# Patient Record
Sex: Male | Born: 1990 | Race: White | Hispanic: No | Marital: Single | Smoking: Never smoker
Health system: Southern US, Community
[De-identification: ages and names within clinical notes are randomized; demographics above are authoritative.]

---
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
## 2011-01-30 ENCOUNTER — Emergency Department: Payer: Self-pay | Admitting: Emergency Medicine

## 2011-08-11 ENCOUNTER — Emergency Department: Payer: Self-pay | Admitting: Emergency Medicine

## 2012-02-15 IMAGING — CT CT CERVICAL SPINE WITHOUT CONTRAST
1 series · 12 of 14 positions shown, 15 images · non-contrast
Comparison: none

REASON FOR EXAM: syncope with occiptal trauma
COMMENTS:

PROCEDURE:     CT  - CT CERVICAL SPINE WO  - January 30, 2011 [DATE]
RESULT:     CT cervical spine
TECHNIQUE: Helical 2 mm sections were obtained. Reconstructions were
performed utilizing a bone algorithm in coronal, sagittal, and axial planes.

[Series 5: axial · axial · 0.24mm/px · z∈[+118,+266]mm · 12 of 89 slices shown, 15 images]
[im 7/89  soft-tissue]
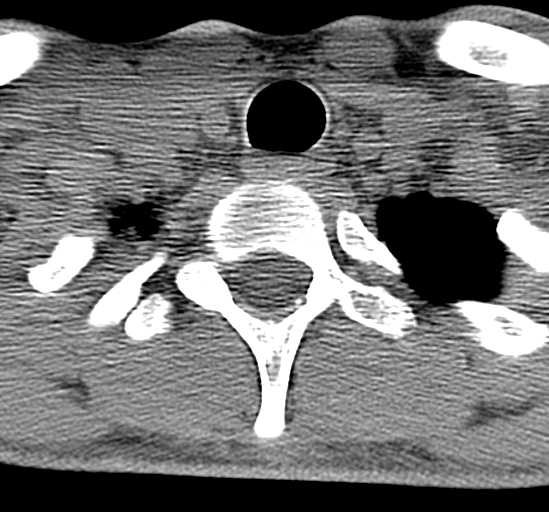
[im 7/89  bone]
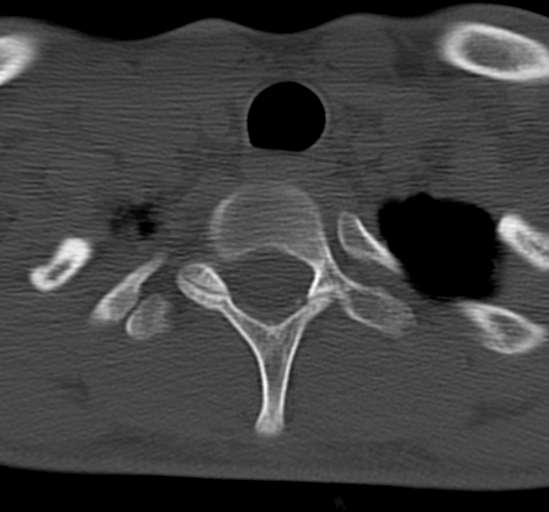
[im 14/89  bone]
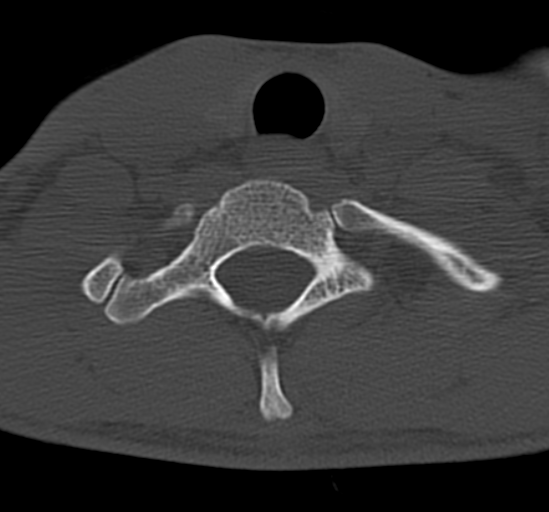
[im 21/89  bone]
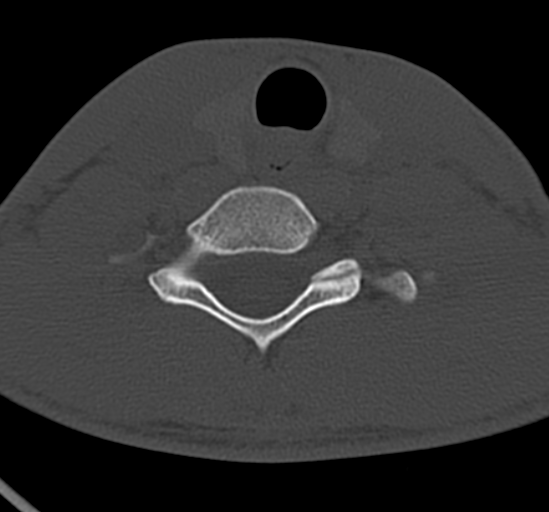
[im 28/89  bone]
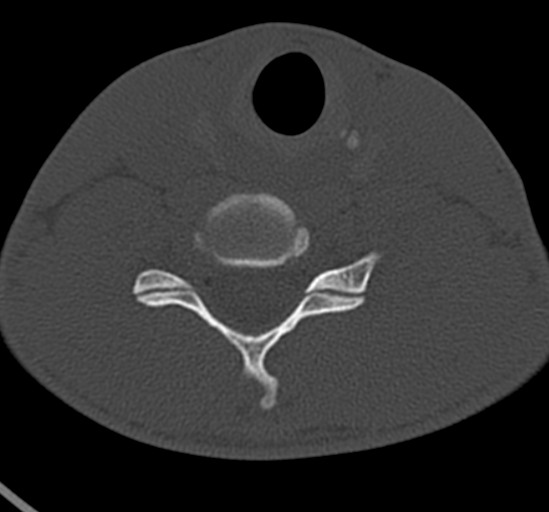
[im 34/89  soft-tissue]
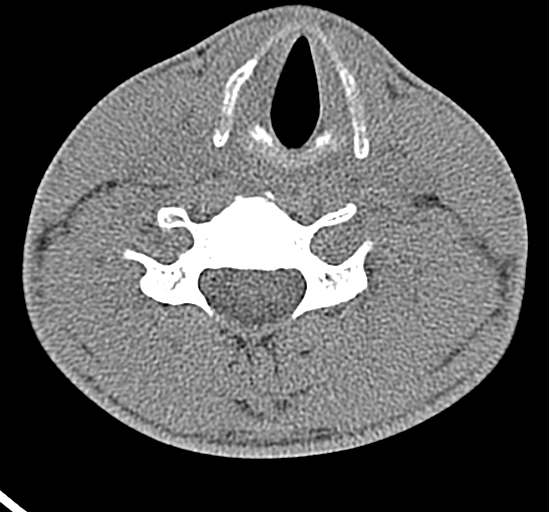
[im 34/89  bone]
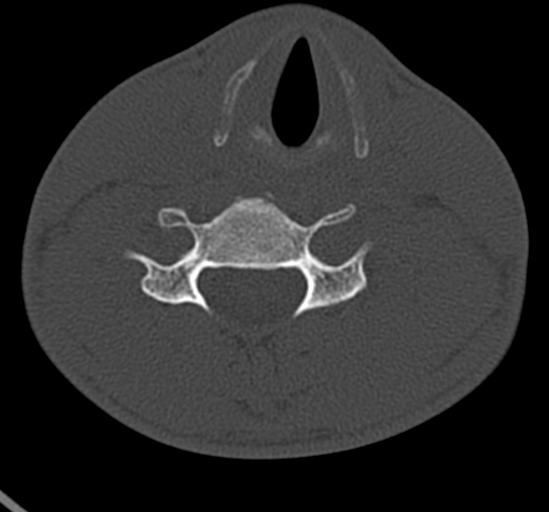
[im 41/89  bone]
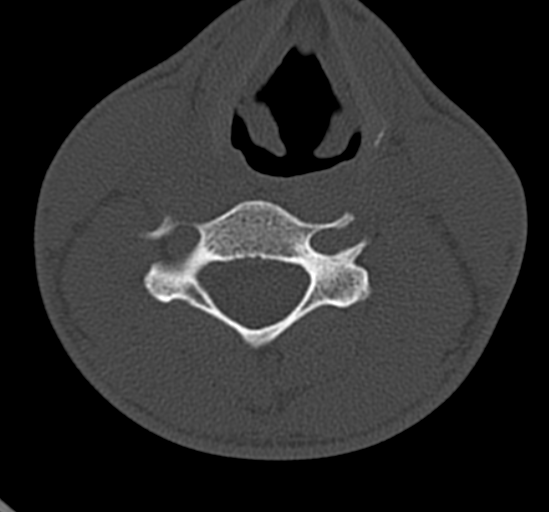
[im 48/89  bone]
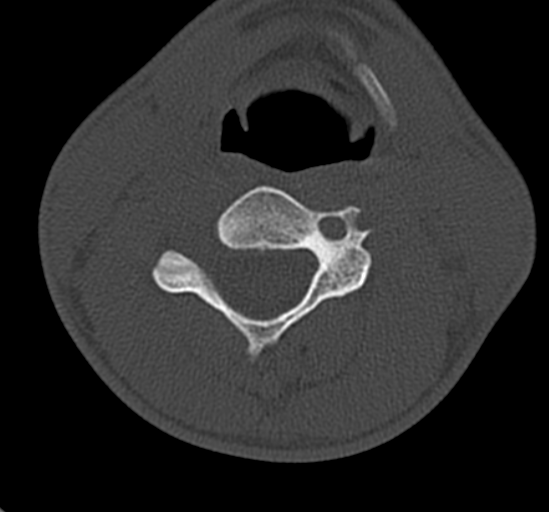
[im 55/89  bone]
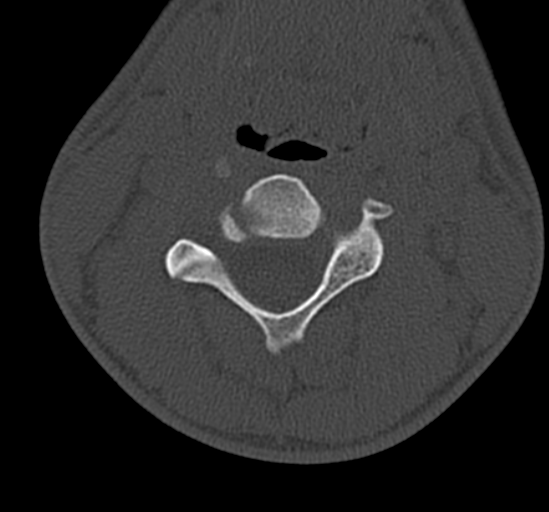
[im 61/89  soft-tissue]
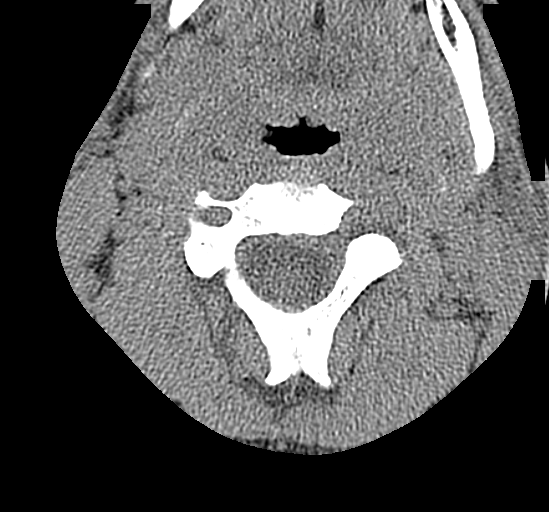
[im 61/89  bone]
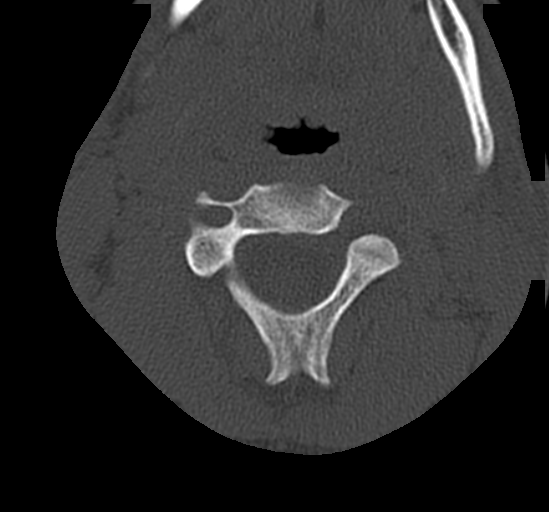
[im 68/89  bone]
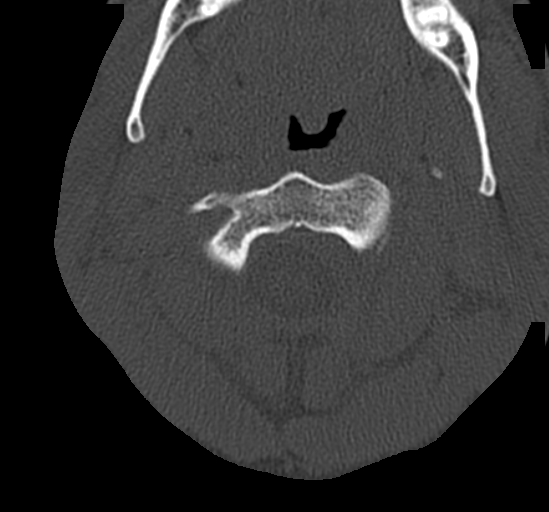
[im 75/89  bone]
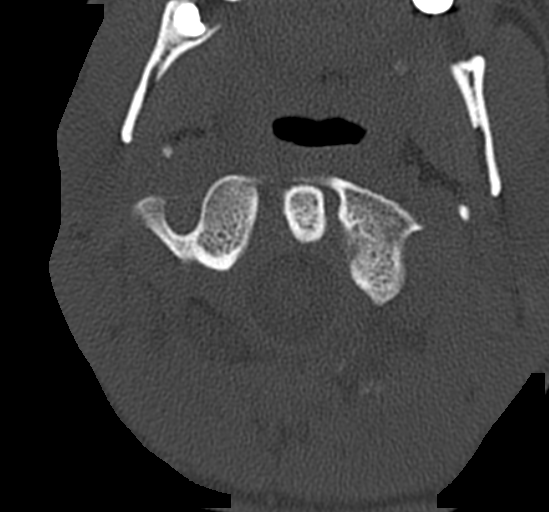
[im 82/89  bone]
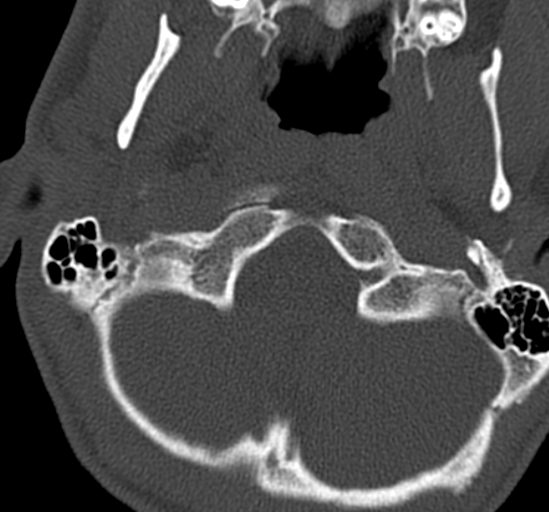

[12 of 14 positions shown; findings below may reference images not displayed]

FINDINGS: There is no evidence of acute fracture or dislocation. There is
dextroscoliosis of the cervical spine likely positional in nature. There is
mild reversible cervical lordosis likely due to positioning, muscle spasm
and/or collar placement. If there is concern of ligamentous instability
further evaluation with flexion and extension views recommended. There is no
evidence of prevertebral soft tissue swelling nor evidence of canal stenosis.
IMPRESSION: No CT evidence of acute osseous abnormalities.
2. Mild areas of malalignment as described above.

## 2012-02-15 IMAGING — CT CT HEAD WITHOUT CONTRAST
2 series · 16 of 30 positions shown, 20 images · non-contrast
Comparison: none

REASON FOR EXAM: syncope, occipital laceration
COMMENTS:

PROCEDURE:     CT  - CT HEAD WITHOUT CONTRAST  - January 30, 2011 [DATE]
RESULT:     Technique: Helical 5mm sections were obtained from the skull
base to the vertex without administration of intravenous contrast.

[Series 2: without · axial · non-contrast · 0.43mm/px · z∈[+232,+362]mm · 13 of 32 slices shown, 17 images]
[im 3/32  brain]
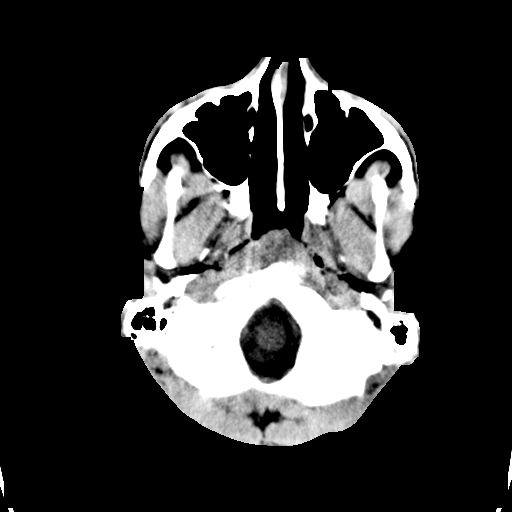
[im 3/32  bone]
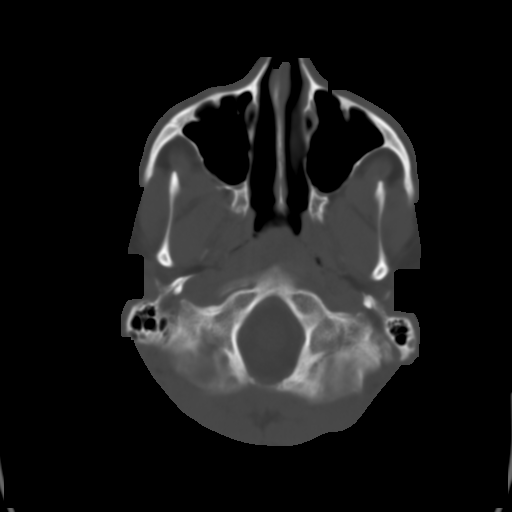
[im 5/32  brain]
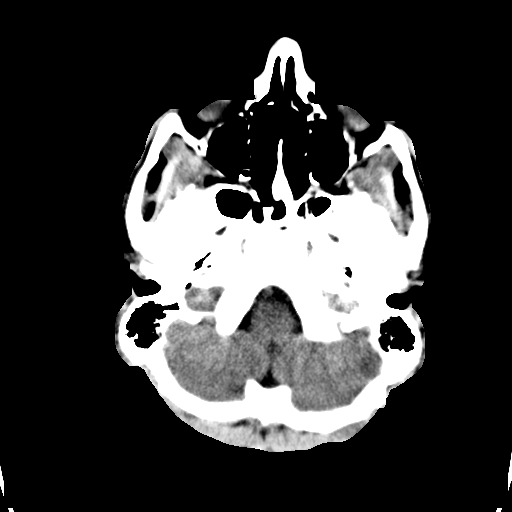
[im 7/32  brain]
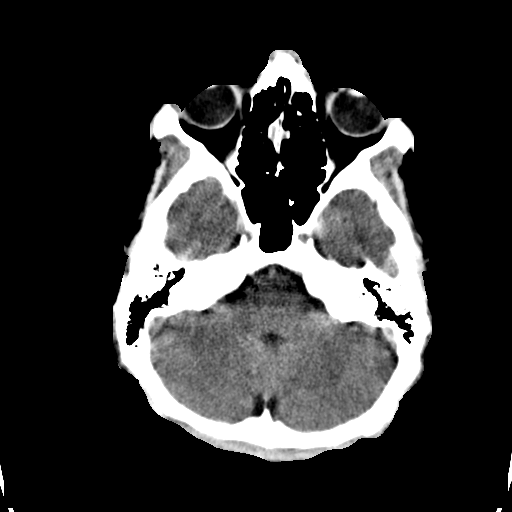
[im 9/32  brain]
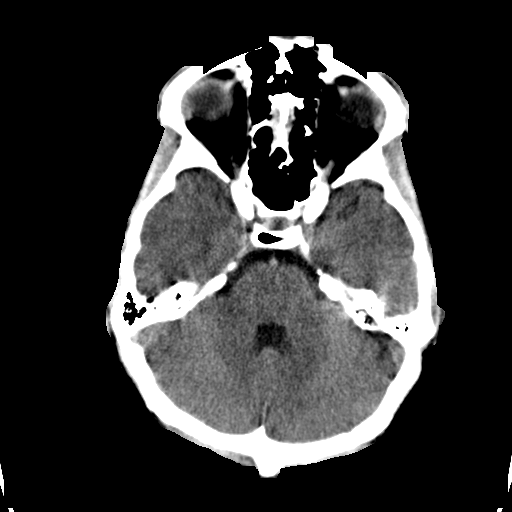
[im 12/32  brain]
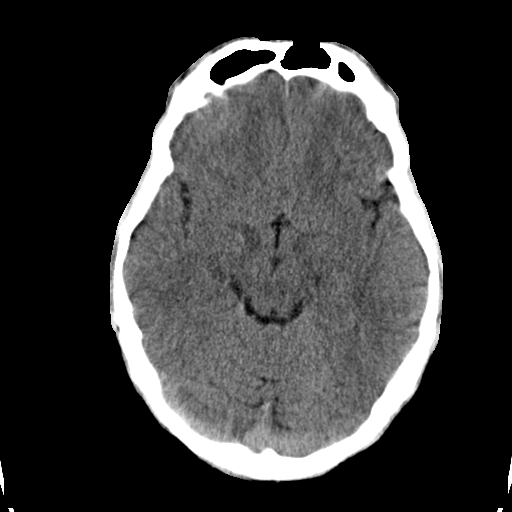
[im 12/32  bone]
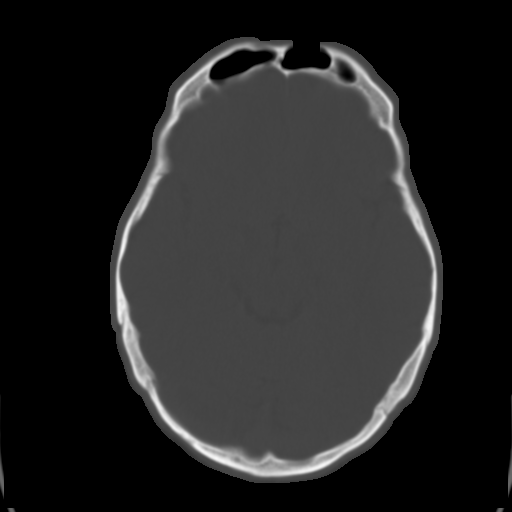
[im 14/32  brain]
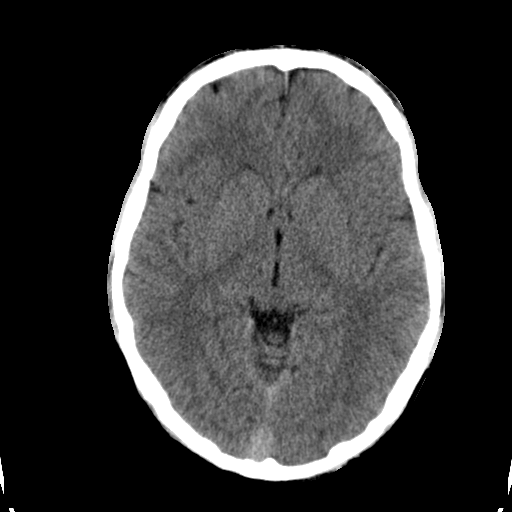
[im 16/32  brain]
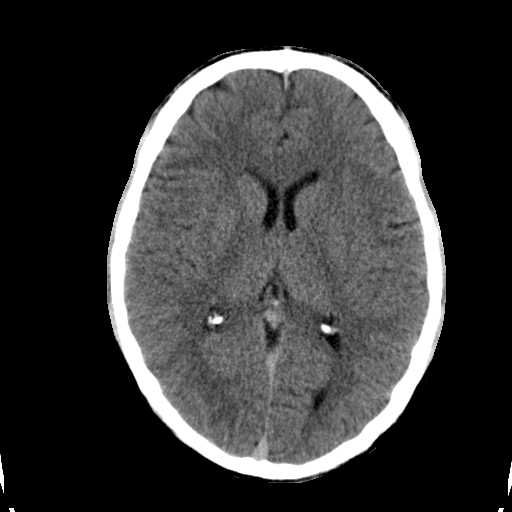
[im 18/32  brain]
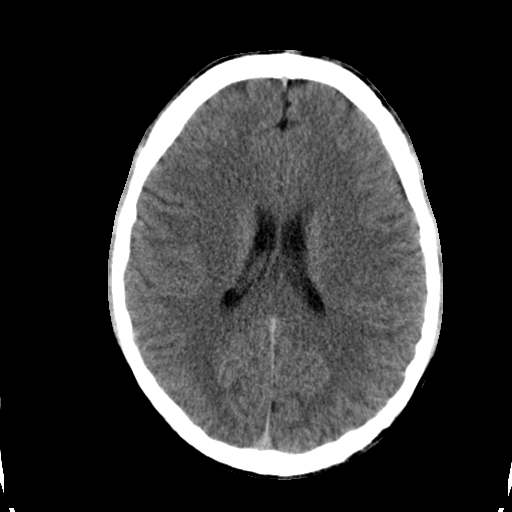
[im 20/32  brain]
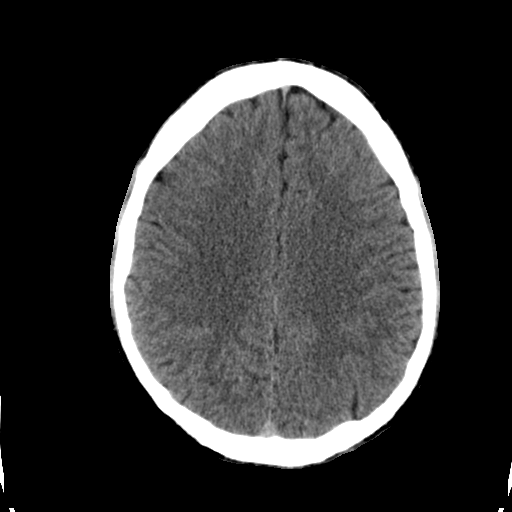
[im 20/32  bone]
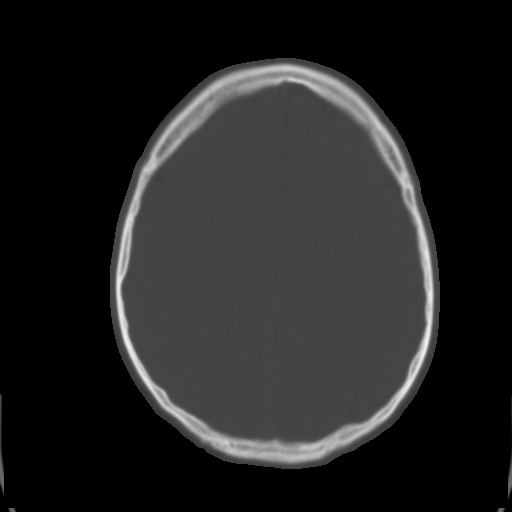
[im 23/32  brain]
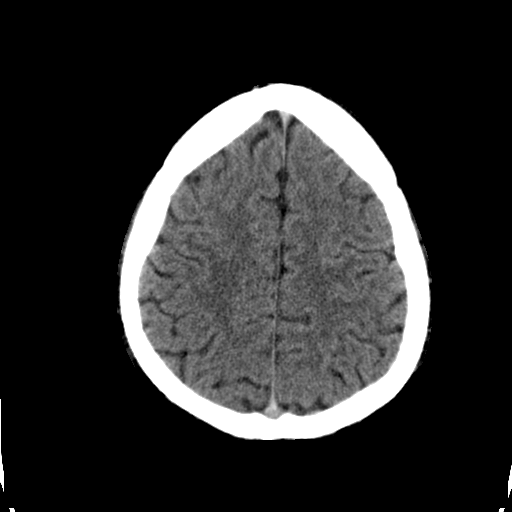
[im 25/32  brain]
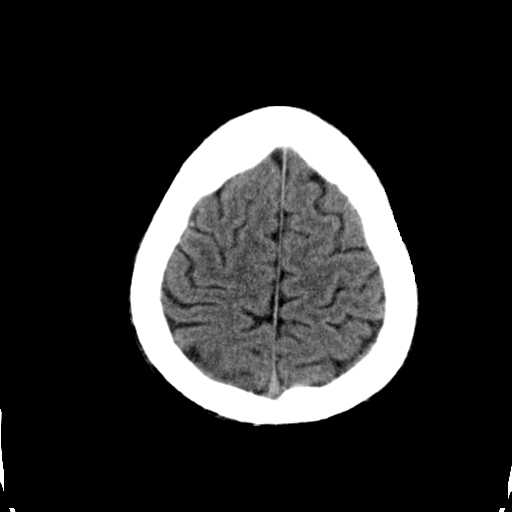
[im 27/32  brain]
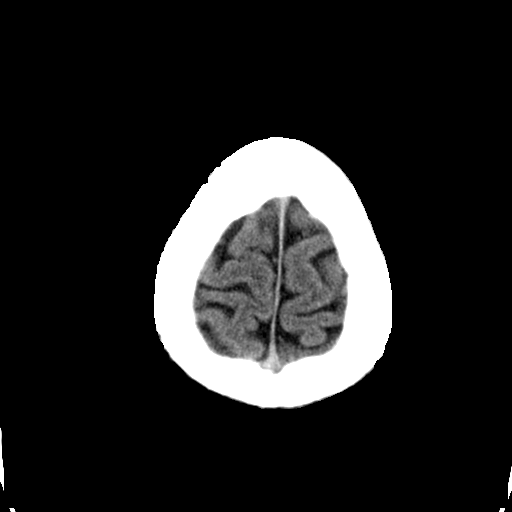
[im 29/32  brain]
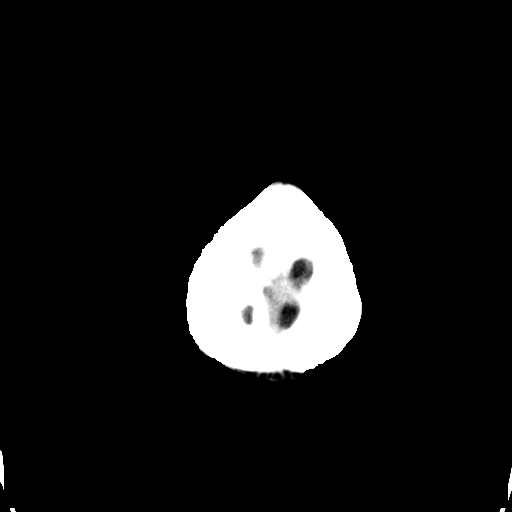
[im 29/32  bone]
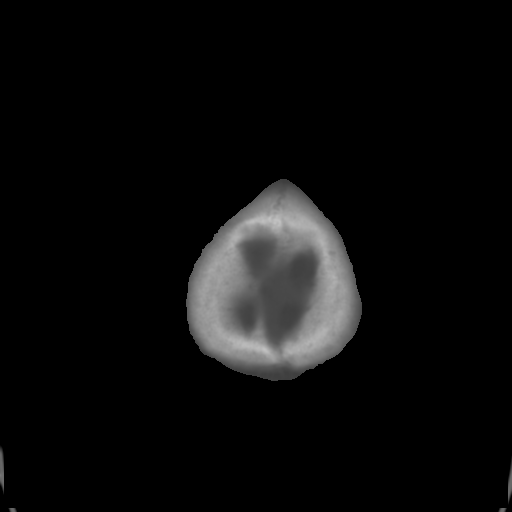

[Series 3: bone · axial · 0.43mm/px · z∈[+232,+278]mm · 3 of 32 slices shown]
[im 3/32  bone]
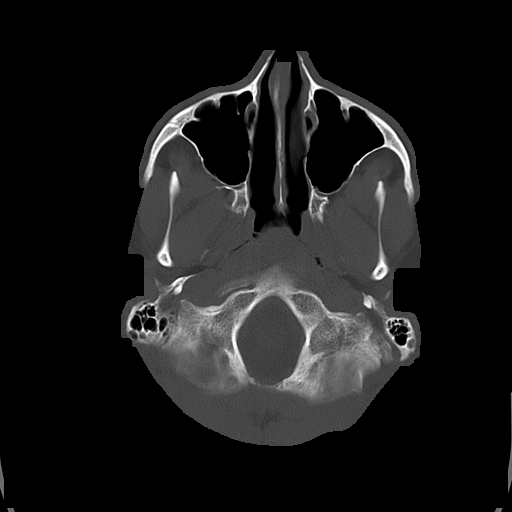
[im 7/32  bone]
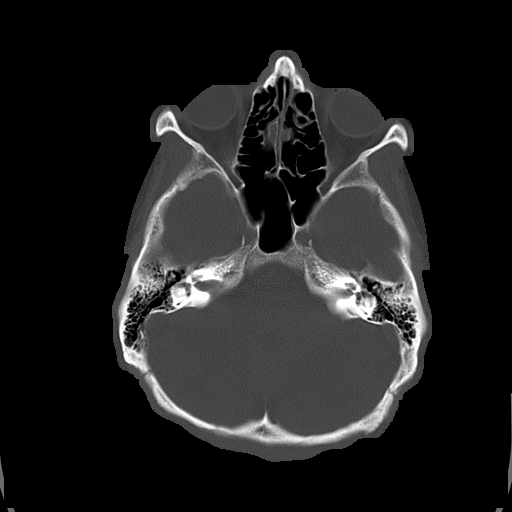
[im 12/32  bone]
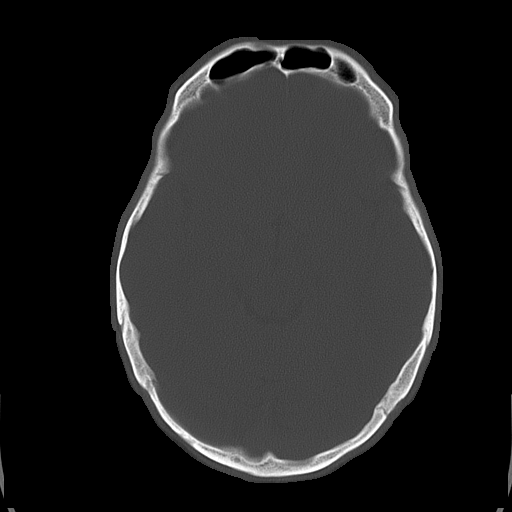

[16 of 30 positions shown; findings below may reference images not displayed]

FINDINGS: There is not evidence of intra-axial fluid collections. There is
no evidence of acute hemorrhage or secondary signs reflecting mass effect or
subacute or chronic focal territorial infarction. The osseous structures
demonstrate no evidence of a depressed skull fracture. If there is
persistent concern clinical follow-up with MRI is recommended.
IMPRESSION: 1. No evidence of acute intracranial abnormalitites.

## 2014-02-24 ENCOUNTER — Emergency Department: Payer: Self-pay

## 2023-01-17 ENCOUNTER — Ambulatory Visit (INDEPENDENT_AMBULATORY_CARE_PROVIDER_SITE_OTHER): Payer: Medicaid Other | Admitting: Adult Health

## 2023-01-17 ENCOUNTER — Encounter: Payer: Self-pay | Admitting: Adult Health

## 2023-01-17 VITALS — BP 120/80 | HR 65 | Temp 98.8°F | Wt 211.4 lb

## 2023-01-17 DIAGNOSIS — M25511 Pain in right shoulder: Secondary | ICD-10-CM

## 2023-01-17 MED ORDER — IBUPROFEN 600 MG PO TABS: 600.0000 mg | ORAL_TABLET | Freq: Three times a day (TID) | ORAL | 0 refills | Status: DC | PRN

## 2023-01-17 MED ORDER — CYCLOBENZAPRINE HCL 10 MG PO TABS: 10.0000 mg | ORAL_TABLET | Freq: Every evening | ORAL | 0 refills | Status: DC | PRN

## 2023-01-17 NOTE — Progress Notes (Signed)
[facility] [address], [place] [address]   Office Visit Note  Patient Name: Luke Blair Date of Birth [birth year]  Medical Record number [id number]  Date of Service: 01/17/2023  Chief Complaint  Patient presents with   Shoulder Pain    Last week started getting burning sensation going up right shoulder to neck. Taking Advil. Shoulder blade to right under ear. Has been having a headache.      Shoulder Pain    Pt is here for a sick visit. Patient reports about a week ago he noticed a pain that started in his right scapula and radiated up and into the back/right side of his neck.  He describes it as a nagging pain, but now its getting worse. He describes it as a burning sensation, and some movements give a sharper pain.  The pain/tensions in his neck is giving him headaches as well.  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   Current Medication:  Outpatient Encounter Medications as of 01/17/2023  Medication Sig   cyclobenzaprine (FLEXERIL) 10 MG tablet Take 1 tablet (10 mg total) by mouth at bedtime as needed for muscle spasms.   ibuprofen (ADVIL) 600 MG tablet Take 1 tablet (600 mg total) by mouth every 8 (eight) hours as needed.   No facility-administered encounter medications on file as of 01/17/2023.      Medical History: No past medical history on file.   Vital Signs: BP 120/80 (BP Location: Left Arm, Patient Position: Sitting, Cuff Size: Normal)   Pulse 65   Temp 98.8 F (37.1 C) (Tympanic)   Wt 211 lb 6.4 oz (95.9 kg)   SpO2 98%    Review of Systems  Musculoskeletal:  Positive for neck pain and neck stiffness.       Right shoulder pain    Physical Exam Vitals and nursing note reviewed.  Constitutional:      Appearance: Normal appearance.  HENT:     Head: Normocephalic.  Musculoskeletal:     Comments: Right shoulder pain. Increased with movement and palpation over right side of  neck, and sub scapular.   Neurological:     Mental Status: He is alert.     Assessment/Plan: 1. Acute pain of right shoulder Take Flexeril at bedtime, take Advil every 6-8 hours.  Follow up in a 3-5 days if no improvement or worsening symptoms.  Would consider steroid dose pack.  - cyclobenzaprine (FLEXERIL) 10 MG tablet; Take 1 tablet (10 mg total) by mouth at bedtime as needed for muscle spasms.  Dispense: 15 tablet; Refill: 0 - ibuprofen (ADVIL) 600 MG tablet; Take 1 tablet (600 mg total) by mouth every 8 (eight) hours as needed.  Dispense: 30 tablet; Refill: 0     General Counseling: darrick greenlaw understanding of the findings of todays visit and agrees with plan of treatment. I have discussed any further diagnostic evaluation that may be needed or ordered today. We also reviewed his medications today. he has been encouraged to call the office with any questions or concerns that should arise related to todays visit.   No orders of the defined types were placed in this encounter.   Meds ordered this encounter  Medications   cyclobenzaprine (FLEXERIL) 10 MG tablet    Sig: Take 1 tablet (10 mg total) by mouth at bedtime as needed for muscle spasms.    Dispense:  15 tablet  Refill:  0   ibuprofen (ADVIL) 600 MG tablet    Sig: Take 1 tablet (600 mg total) by mouth every 8 (eight) hours as needed.    Dispense:  30 tablet    Refill:  0    Time spent:20 Minutes    Kendell Bane AGNP-C Nurse Practitioner

## 2023-07-09 ENCOUNTER — Encounter: Payer: Self-pay | Admitting: Oncology

## 2023-07-09 ENCOUNTER — Other Ambulatory Visit: Payer: Self-pay

## 2023-07-09 ENCOUNTER — Ambulatory Visit (INDEPENDENT_AMBULATORY_CARE_PROVIDER_SITE_OTHER): Payer: Self-pay | Admitting: Oncology

## 2023-07-09 VITALS — BP 124/82 | HR 64 | Temp 98.2°F

## 2023-07-09 DIAGNOSIS — M546 Pain in thoracic spine: Secondary | ICD-10-CM

## 2023-07-09 MED ORDER — PREDNISONE 20 MG PO TABS: 40.0000 mg | ORAL_TABLET | Freq: Every day | ORAL | 0 refills | Status: DC

## 2023-07-09 NOTE — Progress Notes (Signed)
[facility] [address] [place], [place] [address] Phone: [phone] Fax: [phone]   Office Visit Note  Patient Name: Luke Blair  Date of ONGEX:[id number]  Med Rec number [id number]  Date of Service: 07/09/2023  Patient has no known allergies.  No chief complaint on file.  HPI Patient is an 32 y.o. student here for complaints of right sided shoulder blade pain that started yesterday afternoon while using the edger. 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  Developed a sharp stabbing pain that started in right lower neck and radiated down to right shoulder to mid back that lasted for about 5 minutes and slowly went away.  Felt slightly sore last night but he was able to sleep. Woke up this morning and was unable to turn his head to the right and left his arm above his waist.  Took 2 ibuprofen this morning without any relief.  Attempted to work this morning but was having trouble lifting equipment so came to be evaluated.   Has history of right shoulder pain.  Previously prescribed Flexeril which helped his symptoms but made him sleepy. He is not interested in sedative medications.   Current Medication:  Outpatient Encounter Medications as of 07/09/2023  Medication Sig   cyclobenzaprine (FLEXERIL) 10 MG tablet Take 1 tablet (10 mg total) by mouth at bedtime as needed for muscle spasms.   ibuprofen (ADVIL) 600 MG tablet Take 1 tablet (600 mg total) by mouth every 8 (eight) hours as needed.   No facility-administered encounter medications on file as of 07/09/2023.   Medical History: No past medical history on file.  Vital Signs: There were no vitals taken for this visit.  ROS: As per HPI.  All other pertinent ROS negative.     Review of Systems  Musculoskeletal:  Positive for back pain, myalgias and neck pain.    Physical Exam Vitals reviewed.  Constitutional:      Appearance: Normal appearance.  Musculoskeletal:     Right shoulder: Tenderness present. Decreased range of  motion.     Thoracic back: Tenderness (Right lower trapezius and latissimus tenderness with palpation) present.     Comments: No obvious deformity to right shoulder. No bruising or erythema.   Neurological:     Mental Status: He is alert.     No results found for this or any previous visit (from the past 24 hour(s)).  Assessment/Plan: 1. Acute right-sided thoracic back pain -Exam concerning for musculoskeletal right sided trap/LAT strain secondary to lifting heavy lawn equipment.  Reports some improvement with ibuprofen.  Would like to avoid Flexeril if possible.  Discussed trying prednisone 40 mg each morning x 3 days.  May take Tylenol for breakthrough pain.  A few samples were given to him in clinic.  Discussed side effects of prednisone including increased hunger and irritability.  Take with food.  Please let me know if her symptoms worsen or fail to improve or you are unable to tolerate.  - predniSONE (DELTASONE) 20 MG tablet; Take 2 tablets (40 mg total) by mouth daily with breakfast.  Dispense: 6 tablet; Refill: 0   General Counseling: makell cyr understanding of the findings of todays visit and agrees with plan of treatment. I have discussed any further diagnostic evaluation that may be needed or ordered today. We also reviewed his medications today. he has been encouraged to call the office with any questions or concerns that should arise related to todays visit.   No orders of the defined types were  placed in this encounter.   No orders of the defined types were placed in this encounter.   I spent 20 minutes dedicated to the care of this patient (face-to-face and non-face-to-face) on the date of the encounter to include what is described in the assessment and plan.   Durenda Hurt, NP 07/09/2023 7:44 AM

## 2023-08-20 ENCOUNTER — Other Ambulatory Visit: Payer: Self-pay

## 2023-08-20 ENCOUNTER — Encounter: Payer: Self-pay | Admitting: Physician Assistant

## 2023-08-20 ENCOUNTER — Ambulatory Visit (INDEPENDENT_AMBULATORY_CARE_PROVIDER_SITE_OTHER): Payer: Self-pay | Admitting: Physician Assistant

## 2023-08-20 VITALS — BP 118/80 | HR 54 | Temp 96.4°F | Ht 69.0 in | Wt 210.0 lb

## 2023-08-20 DIAGNOSIS — K529 Noninfective gastroenteritis and colitis, unspecified: Secondary | ICD-10-CM

## 2023-08-20 NOTE — Progress Notes (Signed)
[facility] [address] [place], [place] [address]   Office Visit Note  Patient Name: Luke Blair Date of Birth [birth year]  Medical Record number [id number]  Date of Service: 08/20/2023  Chief Complaint  Patient presents with   Acute Visit    Patient c/o nausea/vomiting. Symptoms began 3 days ago but worsened yesterday. He states he feels much better today than he did yesterday. Denies fever.      32 y/o M presents to the clinic for c/o abdominal pain along with nausea and vomiting x 2 days. Yesterday he felt worse and had multiple episodes of vomiting. Today he hasn't had vomiting, but continues to feel "uneasy" in his abdomen. No known exposure to flu or covid. No recent international travel. NO previous abdominal surgeries. No urinary symptoms. His daughter with similar symptoms last week(10 kids in her class with "stomach bugs").       Current Medication:  Outpatient Encounter Medications as of 08/20/2023  Medication Sig   [DISCONTINUED] cyclobenzaprine (FLEXERIL) 10 MG tablet Take 1 tablet (10 mg total) by mouth at bedtime as needed for muscle spasms.   [DISCONTINUED] ibuprofen (ADVIL) 600 MG tablet Take 1 tablet (600 mg total) by mouth every 8 (eight) hours as needed.   [DISCONTINUED] predniSONE (DELTASONE) 20 MG tablet Take 2 tablets (40 mg total) by mouth daily with breakfast.   No facility-administered encounter medications on file as of 08/20/2023.      Medical History: History reviewed. No pertinent past medical history.   Vital Signs: BP 118/80 (BP Location: Left Arm)   Pulse (!) 54   Temp (!) 96.4 F (35.8 C)   Ht 5\' 9"  (1.753 m)   Wt 210 lb (95.3 kg)   SpO2 98%   BMI 31.01 kg/m    Review of Systems  Constitutional: Negative.   Respiratory: Negative.    Cardiovascular: Negative.   Gastrointestinal:  Positive for nausea and vomiting (none today). Negative for abdominal distention, abdominal pain (+ discomfort), blood in stool, constipation,  diarrhea and rectal pain.    Physical Exam Constitutional:      Appearance: Normal appearance.  HENT:     Right Ear: External ear normal.     Left Ear: External ear normal.  Eyes:     Extraocular Movements: Extraocular movements intact.  Cardiovascular:     Rate and Rhythm: Regular rhythm. Bradycardia present.  Pulmonary:     Effort: Pulmonary effort is normal.     Breath sounds: Normal breath sounds.  Abdominal:     General: Bowel sounds are normal. There is no distension.     Palpations: Abdomen is soft.     Tenderness: There is no abdominal tenderness. There is no right CVA tenderness, left CVA tenderness, guarding or rebound.  Skin:    General: Skin is warm.  Neurological:     Mental Status: He is alert.  Psychiatric:        Mood and Affect: Mood normal.        Behavior: Behavior normal.        Thought Content: Thought content normal.        Judgment: Judgment normal.       Assessment/Plan:  1. Gastroenteritis  Discussed with patient that his symptoms are most likely viral. Will treat this conservatively. Stay well hydrated with sipping clear liquids 1-2 oz every 10-15 minutes as tolerated. Start a [employer]. Advance to regular diet if tolerated Avoid uncooked/undercooked meats/fish for 24 hours Continue to watch for  worsening symptoms RTC if symptoms don't improve or worsen. Pt verbalized understanding and in agreement.    General Counseling: anael yildiz understanding of the findings of todays visit and agrees with plan of treatment. I have discussed any further diagnostic evaluation that may be needed or ordered today. We also reviewed his medications today. he has been encouraged to call the office with any questions or concerns that should arise related to todays visit.    Time spent:20 Minutes    Gilberto Better, [place] Physician Assistant

## 2023-11-12 ENCOUNTER — Other Ambulatory Visit: Payer: Self-pay

## 2023-11-12 ENCOUNTER — Ambulatory Visit (INDEPENDENT_AMBULATORY_CARE_PROVIDER_SITE_OTHER): Payer: Self-pay | Admitting: Physician Assistant

## 2023-11-12 ENCOUNTER — Encounter: Payer: Self-pay | Admitting: Physician Assistant

## 2023-11-12 VITALS — BP 120/84 | HR 72 | Temp 97.6°F | Ht 69.0 in | Wt 210.0 lb

## 2023-11-12 DIAGNOSIS — H5789 Other specified disorders of eye and adnexa: Secondary | ICD-10-CM

## 2023-11-12 MED ORDER — ERYTHROMYCIN 5 MG/GM OP OINT: 1.0000 | TOPICAL_OINTMENT | Freq: Every day | OPHTHALMIC | 0 refills | Status: DC

## 2023-11-12 NOTE — Progress Notes (Signed)
[facility] [address]  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 Wind shifted and the leaves and dust blew under his sunglasses into his eyes. Irritated his eyes. Eyes felt like "burning". Difficult for him to keep his eyes open. He does not wear corrective glasses or contact lenses.   O: NAD Eyes: +inflammation b/l bottom eyelid. +skin irritation with slight faint redness over the supra and infra orbits. EOM- WNL. No discharge present. No hordeolum.  VA: uncorrected vision 20/30-OU; 20/30-OS; 20/30; OD No uptake of fluorescein stain in both eyes.  A/P: Diagnosis  Irritation of both eyes H57.89  Wash both eyes with saline eye wash as instructed. Apply cold compresses to both areas every 3-4 hours as needed. Wear sunglasses outdoors. Continue to watch for worsening symptoms. RTCin 6 days or sooner if any difficulty arises. Pt verbalized understanding and in agreement.   Medications Prescribed  Rx-Erythromycin ophthalmic ointment    Referred to none    Return to Work Status Regular duty. Must wear sunglasses outdoors.   Provider Signature ________________________________________Date_________   Employee Signature  _______________________________________Date_________   Please email this completed form to Angus Seller, Director of Risk Management at [email or web address] within 24 hours of visit.

## 2023-11-18 ENCOUNTER — Other Ambulatory Visit: Payer: Medicaid Other | Admitting: Physician Assistant

## 2023-11-18 ENCOUNTER — Telehealth: Payer: Self-pay

## 2023-11-18 NOTE — Telephone Encounter (Signed)
Left message requesting to have patient call back to reschedule F/U appt for worker's comp injury.

## 2023-11-18 NOTE — Telephone Encounter (Signed)
Called patient to see if there has been improvement in his eye symptoms. Patient apologized for forgetting his appointment his morning but states his eyes are better now. He used saline bullets for several days following the incident and irritation has resolved. Patient expressed gratitude for the call.

## 2023-11-19 ENCOUNTER — Other Ambulatory Visit: Payer: Self-pay

## 2023-11-19 ENCOUNTER — Encounter: Payer: Self-pay | Admitting: Physician Assistant

## 2023-11-19 ENCOUNTER — Ambulatory Visit (INDEPENDENT_AMBULATORY_CARE_PROVIDER_SITE_OTHER): Payer: Self-pay | Admitting: Physician Assistant

## 2023-11-19 VITALS — BP 120/80 | HR 99 | Temp 95.0°F

## 2023-11-19 DIAGNOSIS — H5789 Other specified disorders of eye and adnexa: Secondary | ICD-10-CM

## 2023-11-19 NOTE — Progress Notes (Signed)
[facility] 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 Doing well  currently. Denies any complaints or pain in the eye. No photophobia. No vision changes.   O: NAD Both Eyes: No discharge, EOB-WNL, No skin irritation around both orbits.  Visual acuity - without corrective glasses: 20/30-OD,OS, OU  A/P: Diagnosis  Irritation of both eyes H57.89   Patient is discharged from the clinic on regular duty.   Medications Prescribed  No orders of the defined types were placed in this encounter.   Referred to none    Return to Work Status Regular duty. Pt is discharged from the clinic.    Provider Signature ________________________________________Date_________   Employee Signature _______________________________________Date_________   Please email this completed form to Angus Seller, Director of Risk Management at [email or web address] within 24 hours of visit.

## 2024-01-01 ENCOUNTER — Ambulatory Visit (INDEPENDENT_AMBULATORY_CARE_PROVIDER_SITE_OTHER): Payer: Self-pay | Admitting: Oncology

## 2024-01-01 ENCOUNTER — Ambulatory Visit
Admission: RE | Admit: 2024-01-01 | Discharge: 2024-01-01 | Disposition: A | Discharge status: Home / Self Care | Payer: Worker's Compensation | Source: Ambulatory Visit | Attending: Oncology | Admitting: Oncology

## 2024-01-01 ENCOUNTER — Other Ambulatory Visit: Payer: Self-pay

## 2024-01-01 ENCOUNTER — Encounter: Payer: Self-pay | Admitting: Oncology

## 2024-01-01 VITALS — BP 120/80 | HR 80 | Temp 95.1°F | Ht 69.0 in

## 2024-01-01 DIAGNOSIS — S6710XA Crushing injury of unspecified finger(s), initial encounter: Secondary | ICD-10-CM

## 2024-01-01 NOTE — Progress Notes (Signed)
 [facility] [address]  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  He was wearing gloves.  He was able to remove his pinky finger almost immediately.  Did not feel any pain until he removed his glove.  He noticed it was immediately swollen and he had trouble bending his finger.  Provider Note Assessed right pinky arm, hand, wrist and joints of the hand.  No obvious deformity although quite a bit of swelling to right middle and proximal phlanax and to small metacarpal. Unable to bend DIP, PIP, MCP joint to right pinky finger. Full ROM to [facility] joint.   We discussed ice and compression but would first like to get imaging to rule out fracture.  He was provided with additional Ace bandage and instructed on how to wrap his pinky finger to his ring finger and then wrap around his wrist for support.   Diagnosis  1. Crushing injury of finger, initial encounter (Primary)  - DG Hand Complete Right; Future   Medications: He was given four 200 mg ibuprofen  while in clinic for 6 out of 10 finger pain.  No prescriptive medications given.  Recommend ice 3 times daily and keep compressed and wrapped until we get imaging back. No orders of the  defined types were placed in this encounter.   Referred to None  Return to Work Status 1-2 days    Provider Signature ________________________________________Date_________   Field seismologist _______________________________________Date_________   Please email this completed form to Markham Silence, Director of Risk Management at [email or web address] within 24 hours of visit.

## 2024-01-01 NOTE — Addendum Note (Signed)
 Addended by: Charlton Cooler E on: 01/01/2024 01:51 PM   Modules accepted: Orders

## 2024-03-19 ENCOUNTER — Ambulatory Visit (INDEPENDENT_AMBULATORY_CARE_PROVIDER_SITE_OTHER): Payer: Self-pay | Admitting: Adult Health

## 2024-03-19 ENCOUNTER — Other Ambulatory Visit: Payer: Self-pay

## 2024-03-19 VITALS — BP 160/98 | HR 79 | Temp 97.5°F | Ht 69.0 in

## 2024-03-19 DIAGNOSIS — M542 Cervicalgia: Secondary | ICD-10-CM

## 2024-03-19 MED ORDER — CYCLOBENZAPRINE HCL 10 MG PO TABS: 10.0000 mg | ORAL_TABLET | Freq: Every day | ORAL | 0 refills | Status: DC

## 2024-03-19 NOTE — Progress Notes (Signed)
 [facility] [address] [place], [place] [address]   Office Visit Note  Patient Name: Luke Blair Date of Birth [birth year]  Medical Record number [id number]  Date of Service: 03/19/2024  Chief Complaint  Patient presents with   Neck Pain    Patient c/o having a right-sided sharp neck pain that is traveling to his head. 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 He reports having a hx of nerve impingement in neck on the same side a few months ago.      Neck Pain  Associated symptoms include headaches. Pertinent negatives include no chest pain, fever or weakness.   Pt is here for a sick visit. He reports when he got off the mower here at [facility], he started having severe pain, and tightness.  Which went up over his right ear into his head.   Current Medication:  Outpatient Encounter Medications as of 03/19/2024  Medication Sig   cyclobenzaprine (FLEXERIL) 10 MG tablet Take 1 tablet (10 mg total) by mouth at bedtime.   No facility-administered encounter medications on file as of 03/19/2024.      Medical History: No past medical history on file.   Vital Signs: BP (!) 160/98   Pulse 79   Temp (!) 97.5 F (36.4 C)   Ht 5\' 9"  (1.753 m)   SpO2 99%   BMI 31.01 kg/m    Review of Systems  Constitutional:  Negative for chills, fatigue and fever.  Respiratory:  Negative for cough.   Cardiovascular:  Negative for chest pain.  Musculoskeletal:  Positive for neck pain and neck stiffness.  Neurological:  Positive for headaches. Negative for dizziness, tremors, syncope, facial asymmetry, speech difficulty, weakness and light-headedness.    Physical Exam Vitals reviewed.  Constitutional:      Appearance: Normal appearance.  Neck:      Comments: Pain, tenderness with palpation Neurological:     Mental Status: He is alert.    Assessment/Plan: Repeat BP 130/70 1. Neck pain (Primary) Take flexeril at bedtime as discussed.  Use Biofreeze and Ibuprofen  as discussed.  Follow up via MyChart messenger if symptoms fail to improve or may return to clinic as needed for worsening symptoms.   - cyclobenzaprine (FLEXERIL) 10 MG tablet; Take 1 tablet (10 mg total) by mouth at bedtime.  Dispense: 20 tablet; Refill: 0        General Counseling: kollen armenti understanding of the findings of todays visit and agrees with plan of treatment. I have discussed any further diagnostic evaluation that may be needed or ordered today. We also reviewed his medications today. he has been encouraged to call the office with any questions or concerns that should arise related to todays visit.   No orders of the defined types were placed in this encounter.   Meds ordered this encounter  Medications   cyclobenzaprine (FLEXERIL) 10 MG tablet    Sig: Take 1 tablet (10 mg total) by mouth at bedtime.    Dispense:  20 tablet    Refill:  0    Time spent:15 Minutes    Johnna Acosta AGNP-C Nurse Practitioner

## 2024-10-15 ENCOUNTER — Encounter: Payer: Self-pay | Admitting: Adult Health

## 2024-10-15 ENCOUNTER — Ambulatory Visit (INDEPENDENT_AMBULATORY_CARE_PROVIDER_SITE_OTHER): Payer: Self-pay | Admitting: Adult Health

## 2024-10-15 VITALS — BP 120/90 | HR 75 | Temp 96.9°F | Ht 69.0 in | Wt 210.0 lb

## 2024-10-15 DIAGNOSIS — M542 Cervicalgia: Secondary | ICD-10-CM

## 2024-10-15 DIAGNOSIS — R519 Headache, unspecified: Secondary | ICD-10-CM

## 2024-10-15 MED ORDER — CYCLOBENZAPRINE HCL 10 MG PO TABS: 10.0000 mg | ORAL_TABLET | Freq: Every day | ORAL | 0 refills | Status: AC

## 2024-10-15 NOTE — Progress Notes (Signed)
 Therapist, [facility] [address] [place], [place] [address]   Office Visit Note  Patient Name: Luke Blair Date of Birth [birth year]  Medical Record number [id number]  Date of Service: 10/15/2024  Chief Complaint  Patient presents with   Acute Visit    Patient state he woke up this morning with tightness in the R side of his neck which has since moved up to his head. He states his head is throbbing and he has pain in his R eye. He took ASA this morning.     HPI Pt is here for a sick visit. He reports waking up with right side neck pain.  He reports it has moved up into his head, and now his eye is watering.   Last weekend he thinks he had food poisoning from a wedding rehearsal dinner. Otherwise has been normal.  He has taken some aspirin this morning, which helped briefly.    Current Medication:  Outpatient Encounter Medications as of 10/15/2024  Medication Sig   cyclobenzaprine  (FLEXERIL ) 10 MG tablet Take 1 tablet (10 mg total) by mouth at bedtime.   [DISCONTINUED] cyclobenzaprine  (FLEXERIL ) 10 MG tablet Take 1 tablet (10 mg total) by mouth at bedtime.   No facility-administered encounter medications on file as of 10/15/2024.      Medical History: History reviewed. No pertinent past medical history.   Vital Signs: BP (!) 120/90   Pulse 75   Temp (!) 96.9 F (36.1 C)   Ht 5' 9 (1.753 m)   Wt 210 lb (95.3 kg)   SpO2 98%   BMI 31.01 kg/m    Review of Systems  Constitutional:  Negative for chills, fatigue and fever.  HENT:  Negative for congestion.   Eyes:  Negative for pain and itching.  Respiratory:  Negative for cough.     Physical Exam Constitutional:      Appearance: Normal appearance.  HENT:     Head: Normocephalic.     Right Ear: Tympanic membrane and ear canal normal.     Left Ear: Ear canal normal.     Nose: Nose normal.     Mouth/Throat:     Mouth: Mucous membranes are moist.  Eyes:     General: Lids are normal.        Right eye: No  discharge.     Extraocular Movements: Extraocular movements intact.     Conjunctiva/sclera:     Right eye: Right conjunctiva is injected. No exudate.    Left eye: Left conjunctiva is not injected. No exudate.    Pupils: Pupils are equal, round, and reactive to light.  Neck:     Comments: Right side neck discomfort with palpation, muscles appear tight.   Lymphadenopathy:     Cervical: No cervical adenopathy.  Neurological:     Mental Status: He is alert.    Assessment/Plan: 1. Neck pain (Primary) Take flexeril  as discussed. Follow up via MyChart messenger if symptoms fail to improve or may return to clinic as needed for worsening symptoms.  Use heating pad or something like icy hot.  Take ibuprofen  for pain as well.   2. Head and face pain      General Counseling: shivam mestas understanding of the findings of todays visit and agrees with plan of treatment. I have discussed any further diagnostic evaluation that may be needed or ordered today. We also reviewed his medications today. he has been encouraged to call the office with any questions or concerns that should  arise related to todays visit.   No orders of the defined types were placed in this encounter.   Meds ordered this encounter  Medications   cyclobenzaprine  (FLEXERIL ) 10 MG tablet    Sig: Take 1 tablet (10 mg total) by mouth at bedtime.    Dispense:  30 tablet    Refill:  0    Time spent:15 Minutes    Juliene DOROTHA Howells AGNP-C Nurse Practitioner
